# Patient Record
Sex: Male | Born: 1970 | Race: Black or African American | Hispanic: No | Marital: Single | Smoking: Never smoker
Health system: Southern US, Community
[De-identification: ages and names within clinical notes are randomized; demographics above are authoritative.]

## PROBLEM LIST (undated history)

## (undated) DIAGNOSIS — R7303 Prediabetes: Secondary | ICD-10-CM

## (undated) DIAGNOSIS — I10 Essential (primary) hypertension: Secondary | ICD-10-CM

## (undated) HISTORY — DX: Essential (primary) hypertension: I10

## (undated) HISTORY — DX: Prediabetes: R73.03

---

## 2002-11-24 ENCOUNTER — Emergency Department (HOSPITAL_COMMUNITY): Admission: EM | Admit: 2002-11-24 | Discharge: 2002-11-24 | Payer: Self-pay | Admitting: Emergency Medicine

## 2007-12-26 ENCOUNTER — Encounter (INDEPENDENT_AMBULATORY_CARE_PROVIDER_SITE_OTHER): Payer: Self-pay | Admitting: Nurse Practitioner

## 2007-12-26 ENCOUNTER — Ambulatory Visit: Payer: Self-pay | Admitting: Internal Medicine

## 2007-12-26 LAB — CONVERTED CEMR LAB
AST: 27 units/L (ref 0–37)
Albumin: 4.7 g/dL (ref 3.5–5.2)
BUN: 13 mg/dL (ref 6–23)
Calcium: 9.4 mg/dL (ref 8.4–10.5)
Chloride: 106 meq/L (ref 96–112)
Glucose, Bld: 87 mg/dL (ref 70–99)
Lymphs Abs: 2.4 10*3/uL (ref 0.7–4.0)
Monocytes Relative: 9 % (ref 3–12)
Neutro Abs: 2.5 10*3/uL (ref 1.7–7.7)
Neutrophils Relative %: 44 % (ref 43–77)
Potassium: 4.3 meq/L (ref 3.5–5.3)
RBC: 5.14 M/uL (ref 4.22–5.81)
WBC: 5.6 10*3/uL (ref 4.0–10.5)

## 2008-01-17 ENCOUNTER — Encounter (INDEPENDENT_AMBULATORY_CARE_PROVIDER_SITE_OTHER): Payer: Self-pay | Admitting: Nurse Practitioner

## 2008-01-17 ENCOUNTER — Ambulatory Visit: Payer: Self-pay | Admitting: Internal Medicine

## 2008-01-17 LAB — CONVERTED CEMR LAB: Total CHOL/HDL Ratio: 5

## 2008-01-22 ENCOUNTER — Ambulatory Visit: Payer: Self-pay | Admitting: *Deleted

## 2008-01-30 ENCOUNTER — Ambulatory Visit: Payer: Self-pay | Admitting: Internal Medicine

## 2008-09-13 DIAGNOSIS — I10 Essential (primary) hypertension: Secondary | ICD-10-CM

## 2008-09-13 HISTORY — DX: Essential (primary) hypertension: I10

## 2008-11-12 ENCOUNTER — Encounter (INDEPENDENT_AMBULATORY_CARE_PROVIDER_SITE_OTHER): Payer: Self-pay | Admitting: Internal Medicine

## 2008-11-12 ENCOUNTER — Ambulatory Visit: Payer: Self-pay | Admitting: Internal Medicine

## 2008-11-12 LAB — CONVERTED CEMR LAB
ALT: 56 units/L — ABNORMAL HIGH (ref 0–53)
AST: 28 units/L (ref 0–37)
Albumin: 4.7 g/dL (ref 3.5–5.2)
Alkaline Phosphatase: 65 units/L (ref 39–117)
Glucose, Bld: 99 mg/dL (ref 70–99)
LDL Cholesterol: 166 mg/dL — ABNORMAL HIGH (ref 0–99)
PSA: 0.39 ng/mL (ref 0.10–4.00)
Potassium: 4.1 meq/L (ref 3.5–5.3)
Sodium: 141 meq/L (ref 135–145)
Total Protein: 7.5 g/dL (ref 6.0–8.3)
Triglycerides: 116 mg/dL (ref <150)

## 2008-12-16 ENCOUNTER — Ambulatory Visit: Payer: Self-pay | Admitting: Internal Medicine

## 2008-12-16 ENCOUNTER — Encounter (INDEPENDENT_AMBULATORY_CARE_PROVIDER_SITE_OTHER): Payer: Self-pay | Admitting: Internal Medicine

## 2008-12-16 LAB — CONVERTED CEMR LAB
ALT: 36 units/L (ref 0–53)
AST: 23 units/L (ref 0–37)
Albumin: 4.7 g/dL (ref 3.5–5.2)
Calcium: 9.3 mg/dL (ref 8.4–10.5)
Chloride: 107 meq/L (ref 96–112)
LDL Cholesterol: 68 mg/dL (ref 0–99)
Potassium: 4.1 meq/L (ref 3.5–5.3)
Sodium: 143 meq/L (ref 135–145)
Total Protein: 7.3 g/dL (ref 6.0–8.3)

## 2009-01-09 ENCOUNTER — Ambulatory Visit: Payer: Self-pay | Admitting: Internal Medicine

## 2009-02-14 ENCOUNTER — Ambulatory Visit: Payer: Self-pay | Admitting: Internal Medicine

## 2009-05-20 ENCOUNTER — Encounter (INDEPENDENT_AMBULATORY_CARE_PROVIDER_SITE_OTHER): Payer: Self-pay | Admitting: Internal Medicine

## 2009-05-20 ENCOUNTER — Ambulatory Visit: Payer: Self-pay | Admitting: Internal Medicine

## 2009-05-20 LAB — CONVERTED CEMR LAB
ALT: 42 units/L (ref 0–53)
Albumin: 4.3 g/dL (ref 3.5–5.2)
CO2: 23 meq/L (ref 19–32)
Cholesterol: 188 mg/dL (ref 0–200)
Glucose, Bld: 107 mg/dL — ABNORMAL HIGH (ref 70–99)
LDL Cholesterol: 126 mg/dL — ABNORMAL HIGH (ref 0–99)
Potassium: 4.3 meq/L (ref 3.5–5.3)
Sodium: 142 meq/L (ref 135–145)
Total Protein: 6.7 g/dL (ref 6.0–8.3)
Triglycerides: 76 mg/dL (ref <150)

## 2009-11-14 ENCOUNTER — Ambulatory Visit: Payer: Self-pay | Admitting: Internal Medicine

## 2009-11-14 LAB — CONVERTED CEMR LAB
BUN: 10 mg/dL (ref 6–23)
CO2: 27 meq/L (ref 19–32)
Chloride: 106 meq/L (ref 96–112)
Creatinine, Ser: 0.91 mg/dL (ref 0.40–1.50)
HDL: 38 mg/dL — ABNORMAL LOW (ref >39)
LDL Cholesterol: 93 mg/dL (ref 0–99)
Potassium: 4.1 meq/L (ref 3.5–5.3)
Triglycerides: 136 mg/dL (ref <150)
VLDL: 27 mg/dL (ref 0–40)

## 2010-02-19 ENCOUNTER — Ambulatory Visit: Payer: Self-pay | Admitting: Internal Medicine

## 2010-02-19 LAB — CONVERTED CEMR LAB
CO2: 26 meq/L (ref 19–32)
Calcium: 9.3 mg/dL (ref 8.4–10.5)
Creatinine, Ser: 1.04 mg/dL (ref 0.40–1.50)
HDL: 38 mg/dL — ABNORMAL LOW (ref >39)
Sodium: 139 meq/L (ref 135–145)
Total CHOL/HDL Ratio: 3.3
Triglycerides: 87 mg/dL (ref <150)

## 2018-04-24 ENCOUNTER — Ambulatory Visit: Payer: Self-pay | Admitting: Internal Medicine

## 2018-04-24 ENCOUNTER — Telehealth: Payer: Self-pay | Admitting: Licensed Clinical Social Worker

## 2018-04-24 ENCOUNTER — Encounter: Payer: Self-pay | Admitting: Internal Medicine

## 2018-04-24 VITALS — BP 122/88 | HR 78 | Resp 12 | Ht 72.75 in | Wt 319.0 lb

## 2018-04-24 DIAGNOSIS — I10 Essential (primary) hypertension: Secondary | ICD-10-CM

## 2018-04-24 DIAGNOSIS — L989 Disorder of the skin and subcutaneous tissue, unspecified: Secondary | ICD-10-CM

## 2018-04-24 DIAGNOSIS — H547 Unspecified visual loss: Secondary | ICD-10-CM

## 2018-04-24 DIAGNOSIS — Z6841 Body Mass Index (BMI) 40.0 and over, adult: Secondary | ICD-10-CM

## 2018-04-24 MED ORDER — LOSARTAN POTASSIUM-HCTZ 100-25 MG PO TABS: 1.0000 | ORAL_TABLET | Freq: Every day | ORAL | 11 refills | Status: AC

## 2018-04-24 MED ORDER — CARVEDILOL 25 MG PO TABS: 25.0000 mg | ORAL_TABLET | Freq: Two times a day (BID) | ORAL | 3 refills | Status: DC

## 2018-04-24 NOTE — Telephone Encounter (Signed)
LCSW called patient in order to introduce self and provide information about social work services available at the clinic. Left message.

## 2018-04-24 NOTE — Progress Notes (Signed)
Subjective:    Patient ID: Max Greene, male    DOB: 13-Mar-1971, 47 y.o.   MRN: [id number]  HPI   Here to establish  1.  Left leg bump for 2-3 years.  Has not changed with time.  Feels hard.  Has put on some sort of ointment he cannot recall.  He states it is supposed to get rid of bumps.  He does not know if it is a wart remover.   He denies picking at the lesion.    2.  Eye concerns:  Has noted he has to put things farther away from him to focus.  He does not feel his vision is that concerning, but his mother has been pushing him to get seen for his eyes.   He does work on [employer] regularly.  3.  Essential Hypertension:  Diagnosed 9-10 years ago.   Taking Losartan/hctx 100 mg/25 mg and Carvedilol 25 mg twice daily.  4.  BPH:  Taking [place].  Takes many vitamins and supplements.  5.  Morbid Obesity:  Gained fat weight about the same time his hypertension was diagnosed.  Was previously a Pharmacist, communitybody builder and weighed similar weight.    May eat a banana in the morning.   First main meal is lunch from noon-3 p.m.:  Fried chicken, salad, sometimes fish or pasta dish.  Sprite.   May drink glass of soda once or twice daily.  Rest of time it's water.    Dinner later in evening:  Pasta or fried fish or chicken.  Pasta dish with hamburger and cheese.   Current Meds  Medication Sig  . B Complex Vitamins (VITAMIN B COMPLEX PO) Take 5,000 mg by mouth daily.  . carvedilol (COREG) 25 MG tablet Take 25 mg by mouth 2 (two) times daily with a meal.  . Cholecalciferol (VITAMIN D PO) Take by mouth daily.  Marland Kitchen. losartan-hydrochlorothiazide (HYZAAR) 100-25 MG tablet Take 1 tablet by mouth daily.  . Magnesium 400 MG CAPS Take by mouth daily.  . Omega-3 Fatty Acids (FISH OIL PO) Take by mouth daily.  . Zinc 50 MG CAPS Take by mouth. 2 daily    No Known Allergies   Past Medical History:  Diagnosis Date  . Hypertension 2010   Past Surgical History:  None  Family History  Problem  Relation Age of Onset  . GER disease Mother        With esophageal stricture  . Cancer Father        Lung/smoker  . Kidney Stones Sister   . Asthma Brother        Severe   Social History   Socioeconomic History  . Marital status: Single    Spouse name: Not on file  . Number of children: 0  . Years of education: 5912  . Highest education level: 12th grade  Occupational History  . Not on file  Social Needs  . Financial resource strain: Not on file  . Food insecurity:    Worry: Never true    Inability: Never true  . Transportation needs:    Medical: No    Non-medical: No  Tobacco Use  . Smoking status: Never Smoker  . Smokeless tobacco: Never Used  Substance and Sexual Activity  . Alcohol use: Never    Frequency: Never  . Drug use: Never  . Sexual activity: Not on file  Lifestyle  . Physical activity:    Days per week: 0 days    Minutes  per session: 0 min  . Stress: Not on file  Relationships  . Social connections:    Talks on phone: Not on file    Gets together: Not on file    Attends religious service: Not on file    Active member of club or organization: Not on file    Attends meetings of clubs or organizations: Not on file    Relationship status: Not on file  . Intimate partner violence:    Fear of current or ex partner: Not on file    Emotionally abused: Not on file    Physically abused: Not on file    Forced sexual activity: Not on file  Other Topics Concern  . Not on file  Social History Narrative   Originally from [place].   [employer].   Lives with his mother currently and 2 younger brothers.     Has been working on his own business and saving money.     Review of Systems     Objective:   Physical Exam NAD HEENT: PERRL, pupils 2 mm so unable to see discs well, EOMI, TMs pearly gray.  Throat without injection.   Neck:  Supple, No adenopathy, no thyromegaly Chest:  CTA CV:  RRR with normal S1 and S2, No S3, S4 or murmur.  No  carotid bruits.  Carotid, radial pulses normal and equal LE:  No edema, but skin from pretibial area with flaking and dryness.   Left posterior calf with 4 mm superficial dry wart like lesion, well circumscribed.  After consent obtained, patient agreed to freezing of lesion with liquid nitrogen.       Assessment & Plan:  1.  Decreased visual acuity:  Likely presbyopia.  Patient was able to find reading glasses from boxes we have that work well for him.   Optometrist hand out given as well. If goes and needs glasses, discussed he can get a voucher for the glasses with orange card.  2.  Essential Hypertension:  Controlled.  Refilled Carvedilol and Losartan/hctz.  3.  Morbid Obesity:  Discussed lifestyle changes, including diet, eating behavior and regular physical activity at length.  4.  Benign lesion, left posterior calf area:  Frozen.  To call if does not resolve.  5.  HM:  Reportedly had CPE 2 months ago with another clinic.  Send for records. Fasting labs in 1 week:  FLP, CBC, CMP Follow up in 4 months.

## 2018-04-24 NOTE — Patient Instructions (Signed)

## 2018-05-05 ENCOUNTER — Other Ambulatory Visit: Payer: Self-pay

## 2018-05-05 DIAGNOSIS — Z13220 Encounter for screening for lipoid disorders: Secondary | ICD-10-CM

## 2018-05-05 DIAGNOSIS — Z79899 Other long term (current) drug therapy: Secondary | ICD-10-CM

## 2018-05-06 LAB — CBC WITH DIFFERENTIAL/PLATELET
BASOS: 1 %
Basophils Absolute: 0 10*3/uL (ref 0.0–0.2)
EOS (ABSOLUTE): 0.2 10*3/uL (ref 0.0–0.4)
Eos: 3 %
HEMATOCRIT: 46.8 % (ref 37.5–51.0)
Hemoglobin: 15.8 g/dL (ref 13.0–17.7)
Immature Grans (Abs): 0 10*3/uL (ref 0.0–0.1)
Immature Granulocytes: 0 %
LYMPHS ABS: 3.8 10*3/uL — AB (ref 0.7–3.1)
Lymphs: 54 %
MCH: 30.9 pg (ref 26.6–33.0)
MCHC: 33.8 g/dL (ref 31.5–35.7)
MCV: 92 fL (ref 79–97)
MONOS ABS: 0.5 10*3/uL (ref 0.1–0.9)
Monocytes: 7 %
NEUTROS ABS: 2.4 10*3/uL (ref 1.4–7.0)
Neutrophils: 35 %
PLATELETS: 216 10*3/uL (ref 150–450)
RBC: 5.11 x10E6/uL (ref 4.14–5.80)
RDW: 14.9 % (ref 12.3–15.4)
WBC: 6.9 10*3/uL (ref 3.4–10.8)

## 2018-05-06 LAB — COMPREHENSIVE METABOLIC PANEL
A/G RATIO: 1.7 (ref 1.2–2.2)
ALT: 33 IU/L (ref 0–44)
AST: 21 IU/L (ref 0–40)
Albumin: 4.4 g/dL (ref 3.5–5.5)
Alkaline Phosphatase: 80 IU/L (ref 39–117)
BILIRUBIN TOTAL: 0.7 mg/dL (ref 0.0–1.2)
BUN/Creatinine Ratio: 13 (ref 9–20)
BUN: 13 mg/dL (ref 6–24)
CHLORIDE: 99 mmol/L (ref 96–106)
CO2: 24 mmol/L (ref 20–29)
Calcium: 9.5 mg/dL (ref 8.7–10.2)
Creatinine, Ser: 1.02 mg/dL (ref 0.76–1.27)
GFR calc non Af Amer: 86 mL/min/{1.73_m2} (ref >59)
GFR, EST AFRICAN AMERICAN: 99 mL/min/{1.73_m2} (ref >59)
GLOBULIN, TOTAL: 2.6 g/dL (ref 1.5–4.5)
Glucose: 106 mg/dL — ABNORMAL HIGH (ref 65–99)
POTASSIUM: 4.1 mmol/L (ref 3.5–5.2)
SODIUM: 140 mmol/L (ref 134–144)
Total Protein: 7 g/dL (ref 6.0–8.5)

## 2018-05-06 LAB — LIPID PANEL W/O CHOL/HDL RATIO
CHOLESTEROL TOTAL: 212 mg/dL — AB (ref 100–199)
HDL: 36 mg/dL — AB (ref >39)
LDL Calculated: 152 mg/dL — ABNORMAL HIGH (ref 0–99)
TRIGLYCERIDES: 119 mg/dL (ref 0–149)
VLDL CHOLESTEROL CAL: 24 mg/dL (ref 5–40)

## 2018-07-25 ENCOUNTER — Ambulatory Visit: Payer: Self-pay | Admitting: Internal Medicine

## 2018-07-25 ENCOUNTER — Encounter: Payer: Self-pay | Admitting: Internal Medicine

## 2018-07-25 VITALS — BP 140/88 | HR 76 | Resp 14 | Ht 72.75 in | Wt 347.0 lb

## 2018-07-25 DIAGNOSIS — Z23 Encounter for immunization: Secondary | ICD-10-CM

## 2018-07-25 DIAGNOSIS — L989 Disorder of the skin and subcutaneous tissue, unspecified: Secondary | ICD-10-CM

## 2018-07-25 DIAGNOSIS — H547 Unspecified visual loss: Secondary | ICD-10-CM

## 2018-07-25 DIAGNOSIS — E785 Hyperlipidemia, unspecified: Secondary | ICD-10-CM

## 2018-07-25 DIAGNOSIS — R739 Hyperglycemia, unspecified: Secondary | ICD-10-CM

## 2018-07-25 DIAGNOSIS — I10 Essential (primary) hypertension: Secondary | ICD-10-CM

## 2018-07-25 DIAGNOSIS — R7303 Prediabetes: Secondary | ICD-10-CM

## 2018-07-25 DIAGNOSIS — Z6841 Body Mass Index (BMI) 40.0 and over, adult: Secondary | ICD-10-CM

## 2018-07-25 HISTORY — DX: Prediabetes: R73.03

## 2018-07-25 NOTE — Progress Notes (Signed)
   Subjective:    Patient ID: Max Greene, male    DOB: 1970-12-13, 47 y.o.   MRN: [id number]  HPI  1. Morbid Obesity/hyperglycemia/hypercholesterolemia:  Not clear if we weighed him incorrectly at last visit.  If he was weighed correctly, has gained 30 lbs in 3 months.  He states his clothes fit him the same.  Not physically active recently, but states he will start doing so.   Drinking soda one daily.  First meal 10:46 a.m.:  Fast food meal--[employer] chicken, fried fish  2.  Lesion on back of left calf did not resolve.    3.  Hypertension:  Not taking Losartan/HCTZ 100-25 mg.  Sounds like did not get his Losartan combination at the [facility] pharmacy.  He is taking Carvedilol.  Not clear if he takes twice daily on regular basis.  Call to [facility] and found out he did pick up his Losartan/HCTZ once in August, but did not pick up again.   4.  Vision:  Did not get eye check with office in flyer from last visit.  He does not even recall.  Current Meds  Medication Sig  . carvedilol (COREG) 25 MG tablet Take 1 tablet (25 mg total) by mouth 2 (two) times daily with a meal.  . Cholecalciferol (VITAMIN D PO) Take by mouth daily.  . Magnesium 400 MG CAPS Take by mouth daily.  . Omega-3 Fatty Acids (FISH OIL PO) Take by mouth daily.  . Zinc 50 MG CAPS Take by mouth. 2 daily    No Known Allergies Review of Systems     Objective:   Physical Exam Lungs:  CTA CV:  RRR without murmur or rub.  Radial and DP pulses normal and equal LE:  Trace pitting edema of lower legs bilaterally.  Lesion on left lateral posterior calf still there, about 1 cm, dry and elevated, well circumscribed--frozen with liquid nitrogen again.       Assessment & Plan:  1.  Hypertension:  To pick up Losartan/HCTZ and get restarted.  BP and A1c check in 2 weeks.  2.  Hyperglycemia:  A1C with labs in 2 weeks.  3.  Morbid obesity:  Discussed diet and physical activity at length.  He is changing jobs this week and  feels he can get back to better lifestyle.  Will bring his mother in next visit to discuss what she cooks for him as well.  Discussed making small goals to shoot for. Weight entered last check likely incorrect.  4.  Hyperlipidemia:  As above  5.   Benign skin lesion:  Frozen.  6.  HM:  Flu vaccine

## 2018-07-25 NOTE — Patient Instructions (Signed)

## 2018-08-08 ENCOUNTER — Other Ambulatory Visit: Payer: Self-pay

## 2018-08-08 VITALS — BP 150/80 | HR 64

## 2018-08-08 DIAGNOSIS — R739 Hyperglycemia, unspecified: Secondary | ICD-10-CM

## 2018-08-08 DIAGNOSIS — I10 Essential (primary) hypertension: Secondary | ICD-10-CM

## 2018-08-08 NOTE — Progress Notes (Signed)
Patient BP still running high. Patient states he missed last nights dose and this morning. Per Dr. Delrae AlfredMulberry have patient to come back when he has taken his medication. Patient to come back in 2 weeks for repeat BP check. Patient verbalized understanding.

## 2018-08-09 LAB — HGB A1C W/O EAG: HEMOGLOBIN A1C: 6.1 % — AB (ref 4.8–5.6)

## 2018-08-22 ENCOUNTER — Ambulatory Visit: Payer: Self-pay

## 2018-08-22 VITALS — BP 138/88 | HR 80

## 2018-08-22 DIAGNOSIS — I10 Essential (primary) hypertension: Secondary | ICD-10-CM

## 2018-08-22 NOTE — Progress Notes (Signed)
Patient BP now in normal range. Patient informed to continue current dose of medication and make sure he is not skipping doses. Patient verbalized understanding.

## 2018-09-12 ENCOUNTER — Other Ambulatory Visit: Payer: Self-pay

## 2018-09-12 MED ORDER — CARVEDILOL 25 MG PO TABS: 25.0000 mg | ORAL_TABLET | Freq: Two times a day (BID) | ORAL | 2 refills | Status: AC

## 2018-10-08 ENCOUNTER — Encounter: Payer: Self-pay | Admitting: Internal Medicine

## 2018-10-25 ENCOUNTER — Ambulatory Visit: Payer: Self-pay | Admitting: Internal Medicine

## 2019-10-30 ENCOUNTER — Ambulatory Visit: Payer: Self-pay | Admitting: Internal Medicine

## 2019-11-10 ENCOUNTER — Other Ambulatory Visit: Payer: Self-pay | Admitting: Internal Medicine

## 2020-01-28 ENCOUNTER — Other Ambulatory Visit: Payer: Self-pay

## 2020-01-28 ENCOUNTER — Encounter (HOSPITAL_COMMUNITY): Payer: Self-pay

## 2020-01-28 ENCOUNTER — Emergency Department (HOSPITAL_COMMUNITY)
Admission: EM | Admit: 2020-01-28 | Discharge: 2020-01-29 | Disposition: A | Discharge status: Home / Self Care | Payer: Self-pay | Attending: Emergency Medicine | Admitting: Emergency Medicine

## 2020-01-28 DIAGNOSIS — Z5321 Procedure and treatment not carried out due to patient leaving prior to being seen by health care provider: Secondary | ICD-10-CM | POA: Insufficient documentation

## 2020-01-28 DIAGNOSIS — R2243 Localized swelling, mass and lump, lower limb, bilateral: Secondary | ICD-10-CM | POA: Insufficient documentation

## 2020-01-28 NOTE — ED Triage Notes (Signed)
BLE swelling for 2 weeks. Sts it goes away when he takes his [employer] fast streamer for htn.

## 2020-01-29 ENCOUNTER — Ambulatory Visit
Admission: RE | Admit: 2020-01-29 | Discharge: 2020-01-29 | Disposition: A | Discharge status: Home / Self Care | Payer: No Typology Code available for payment source | Source: Ambulatory Visit | Attending: Cardiovascular Disease | Admitting: Cardiovascular Disease

## 2020-01-29 ENCOUNTER — Other Ambulatory Visit: Payer: Self-pay | Admitting: Cardiovascular Disease

## 2020-01-29 DIAGNOSIS — R0602 Shortness of breath: Secondary | ICD-10-CM

## 2020-01-29 DIAGNOSIS — R600 Localized edema: Secondary | ICD-10-CM

## 2020-01-29 NOTE — ED Notes (Addendum)
Pt eloped from waiting room. Called 3X. VS stable.  

## 2021-01-28 ENCOUNTER — Encounter (HOSPITAL_COMMUNITY): Payer: Self-pay

## 2021-01-28 ENCOUNTER — Emergency Department (HOSPITAL_COMMUNITY)
Admission: EM | Admit: 2021-01-28 | Discharge: 2021-01-28 | Disposition: A | Discharge status: Home / Self Care | Payer: Self-pay | Attending: Emergency Medicine | Admitting: Emergency Medicine

## 2021-01-28 ENCOUNTER — Emergency Department (HOSPITAL_COMMUNITY): Payer: Self-pay

## 2021-01-28 ENCOUNTER — Other Ambulatory Visit: Payer: Self-pay

## 2021-01-28 DIAGNOSIS — S6710XA Crushing injury of unspecified finger(s), initial encounter: Secondary | ICD-10-CM

## 2021-01-28 DIAGNOSIS — Z23 Encounter for immunization: Secondary | ICD-10-CM | POA: Insufficient documentation

## 2021-01-28 DIAGNOSIS — I10 Essential (primary) hypertension: Secondary | ICD-10-CM | POA: Insufficient documentation

## 2021-01-28 DIAGNOSIS — W230XXA Caught, crushed, jammed, or pinched between moving objects, initial encounter: Secondary | ICD-10-CM | POA: Insufficient documentation

## 2021-01-28 DIAGNOSIS — Z79899 Other long term (current) drug therapy: Secondary | ICD-10-CM | POA: Insufficient documentation

## 2021-01-28 DIAGNOSIS — S60141A Contusion of right ring finger with damage to nail, initial encounter: Secondary | ICD-10-CM | POA: Insufficient documentation

## 2021-01-28 DIAGNOSIS — S6010XA Contusion of unspecified finger with damage to nail, initial encounter: Secondary | ICD-10-CM

## 2021-01-28 MED ORDER — LIDOCAINE HCL 2 % IJ SOLN
10.0000 mL | Freq: Once | INTRAMUSCULAR | Status: AC
  Administered 2021-01-28: 200 mg via INTRADERMAL
  Filled 2021-01-28: qty 20

## 2021-01-28 MED ORDER — TETANUS-DIPHTH-ACELL PERTUSSIS 5-2.5-18.5 LF-MCG/0.5 IM SUSY
0.5000 mL | PREFILLED_SYRINGE | Freq: Once | INTRAMUSCULAR | Status: AC
  Administered 2021-01-28: 0.5 mL via INTRAMUSCULAR
  Filled 2021-01-28: qty 0.5

## 2021-01-28 MED ORDER — ACETAMINOPHEN 500 MG PO TABS: 1000.0000 mg | ORAL_TABLET | Freq: Once | ORAL | Status: DC

## 2021-01-28 MED ORDER — IBUPROFEN 600 MG PO TABS: 600.0000 mg | ORAL_TABLET | Freq: Four times a day (QID) | ORAL | 0 refills | Status: DC | PRN

## 2021-01-28 NOTE — ED Triage Notes (Signed)
3rd finger (middle) got stomped in door - bluish discoloration of finger nail noted, able to bend.

## 2021-01-28 NOTE — ED Provider Notes (Signed)
[facility] EMERGENCY DEPARTMENT Provider Note   CSN: [id number] Arrival date & time: 01/28/21  1846     History Chief Complaint  Patient presents with  . Finger Injury    Max Greene is a 50 y.o. male.  The history is provided by the patient. No language interpreter was used.     50 year old male presenting for evaluation of a finger injury.  Patient report earlier today he accidentally injured his finger when he slammed it against a door.  Impact was to his right middle finger on his dominant hand.  He reported 5/10 sharp throbbing pain about the finger from the impact.  He denies any numbness and no other injury.  He is unable to recall his last tetanus.  He denies any specific treatment tried.  Past Medical History:  Diagnosis Date  . Hypertension 2010  . Prediabetes 07/25/2018    Patient Active Problem List   Diagnosis Date Noted  . Prediabetes 07/25/2018  . Hyperlipidemia 07/25/2018  . Morbid obesity with BMI of 40.0-44.9, adult (HCC) 07/25/2018  . Hypertension     History reviewed. No pertinent surgical history.     Family History  Problem Relation Age of Onset  . GER disease Mother        With esophageal stricture  . Cancer Father        Lung/smoker  . Kidney Stones Sister   . Asthma Brother        Severe    Social History   Tobacco Use  . Smoking status: Never Smoker  . Smokeless tobacco: Never Used  Substance Use Topics  . Alcohol use: Never  . Drug use: Never    Home Medications Prior to Admission medications   Medication Sig Start Date End Date Taking? Authorizing Provider  B Complex Vitamins (VITAMIN B COMPLEX PO) Take 5,000 mg by mouth daily.    [provider]  carvedilol (COREG) 25 MG tablet Take 1 tablet (25 mg total) by mouth 2 (two) times daily with a meal. 09/12/18   Julieanne Manson, MD  Cholecalciferol (VITAMIN D PO) Take by mouth daily.    [provider]   losartan-hydrochlorothiazide (HYZAAR) 100-25 MG tablet Take 1 tablet by mouth daily. Patient not taking: Reported on 07/25/2018 04/24/18   Julieanne Manson, MD  Magnesium 400 MG CAPS Take by mouth daily.    [provider]  Omega-3 Fatty Acids (FISH OIL PO) Take by mouth daily.    [provider]  Zinc 50 MG CAPS Take by mouth. 2 daily    [provider]    Allergies    Patient has no known allergies.  Review of Systems   Review of Systems  Constitutional: Negative for fever.  Musculoskeletal: Positive for arthralgias.  Skin: Negative for wound.  Neurological: Negative for numbness.    Physical Exam Updated Vital Signs BP (!) 168/103 (BP Location: Right Arm)   Pulse 79   Temp 98.4 F (36.9 C) (Oral)   Resp (!) 22   Ht 6\' 2"  (1.88 m)   Wt (!) 161 kg   SpO2 97%   BMI 45.57 kg/m   Physical Exam Vitals and nursing note reviewed.  Constitutional:      General: He is not in acute distress.    Appearance: He is well-developed.  HENT:     Head: Atraumatic.  Eyes:     Conjunctiva/sclera: Conjunctivae normal.  Musculoskeletal:        General: Signs of injury (  Right middle finger: Tenderness along the distal phalanx on palpation with subungual hematoma involve approximately 90% of the nail.  No joint involvement.  Sensation intact.) present.     Cervical back: Neck supple.  Skin:    Findings: No rash.  Neurological:     Mental Status: He is alert.     ED Results / Procedures / Treatments   Labs (all labs ordered are listed, but only abnormal results are displayed) Labs Reviewed - No data to display  EKG None  Radiology DG Finger Middle Right  Result Date: 01/28/2021 CLINICAL DATA:  Status post trauma. EXAM: RIGHT MIDDLE FINGER 2+V COMPARISON:  None. FINDINGS: Acute, comminuted fracture deformity is seen involving the tuft of the distal phalanx of the third right finger. There is no evidence of dislocation. Diffuse soft tissue swelling  is noted. IMPRESSION: Acute fracture of the distal phalanx of the third right finger. Electronically Signed   By: Aram Candela M.D.   On: 01/28/2021 19:48    Procedures .Ortho Injury Treatment  Date/Time: 01/28/2021 9:39 PM Performed by: Fayrene Helper, PA-C Authorized by: Fayrene Helper, PA-C   Consent:    Consent obtained:  Verbal   Consent given by:  Patient   Risks discussed:  Fracture and vascular damage   Alternatives discussed:  No treatmentInjury location: finger Location details: right ring finger Injury type: fracture Fracture type: distal phalanx MCP joint involved: no IP joint involved: no Pre-procedure neurovascular assessment: neurovascularly intact Pre-procedure distal perfusion: normal Pre-procedure neurological function: normal Pre-procedure range of motion: normal Anesthesia: digital block  Anesthesia: Local anesthesia used: yes Local Anesthetic: lidocaine 2% without epinephrine Anesthetic total: 5 mL  Patient sedated: NoManipulation performed: no Immobilization: splint Splint type: static finger Splint Applied by: Ortho Tech Supplies used: aluminum splint Post-procedure neurovascular assessment: post-procedure neurovascularly intact Post-procedure distal perfusion: normal Post-procedure neurological function: normal Post-procedure range of motion: normal Comments: Patient has a subungual hematoma involving 90% of the fingernail on the right middle finger.  Digital nerve block performed, a Bovie cauterizing tool was used to bur a hole into the nail to evacuate the subungual hematoma.  Patient tolerates well.  Finger splint placed for protection.      Medications Ordered in ED Medications  acetaminophen (TYLENOL) tablet 1,000 mg (has no administration in time range)  Tdap (BOOSTRIX) injection 0.5 mL (0.5 mLs Intramuscular Given 01/28/21 2053)  lidocaine (XYLOCAINE) 2 % (with pres) injection 200 mg (200 mg Intradermal Given by Other 01/28/21 2053)    ED  Course  I have reviewed the triage vital signs and the nursing notes.  Pertinent labs & imaging results that were available during my care of the patient were reviewed by me and considered in my medical decision making (see chart for details).    MDM Rules/Calculators/A&P                          BP (!) 168/103 (BP Location: Right Arm)   Pulse 79   Temp 98.4 F (36.9 C) (Oral)   Resp (!) 22   Ht 6\' 2"  (1.88 m)   Wt (!) 161 kg   SpO2 97%   BMI 45.57 kg/m   Final Clinical Impression(s) / ED Diagnoses Final diagnoses:  Subungual hematoma of digit of hand, initial encounter  Crushing injury of finger, initial encounter    Rx / DC Orders ED Discharge Orders         Ordered    ibuprofen (ADVIL) 600  MG tablet  Every 6 hours PRN        01/28/21 2142        8:48 PM Patient accidentally jammed his right middle finger at the distal phalanx.  X-ray of the finger demonstrate acute fracture of the to the placement of the third right finger without evidence of dislocation.  He does have a subungual hematoma involving approximately 90% of the nailbed.  He will benefit from trephination and will place finger in a finger splint.  Will update tetanus.  9:41 PM Patient has a subungual hematoma involving 90% of the fingernail on the right middle finger.  Digital nerve block performed, a Bovie cauterizing tool was used to bur a hole into the nail to evacuate the subungual hematoma.  Patient tolerates well.  Finger splint placed for protection.   Fayrene Helper, PA-C 01/28/21 2144    Cheryll Cockayne, MD 01/30/21 [phone]

## 2021-01-28 NOTE — Discharge Instructions (Signed)
You have injured your right middle finger.  The tip of the bone of the finger was fractured.  Please wear finger splint for protection.  Take ibuprofen as needed for pain.

## 2021-10-09 IMAGING — DX DG FINGER MIDDLE 2+V*R*
3 series · 3 of 3 positions shown · non-contrast
Comparison: None.

CLINICAL DATA: Status post trauma.

EXAM:
RIGHT MIDDLE FINGER 2+V

[finger ap]
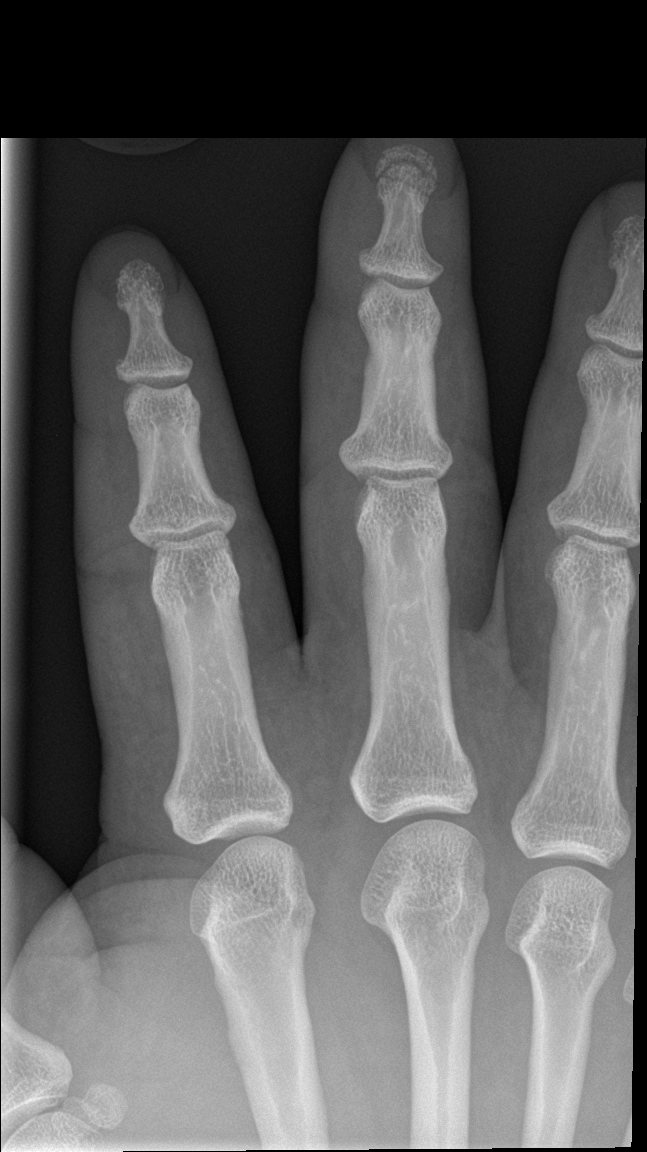

[finger obl]
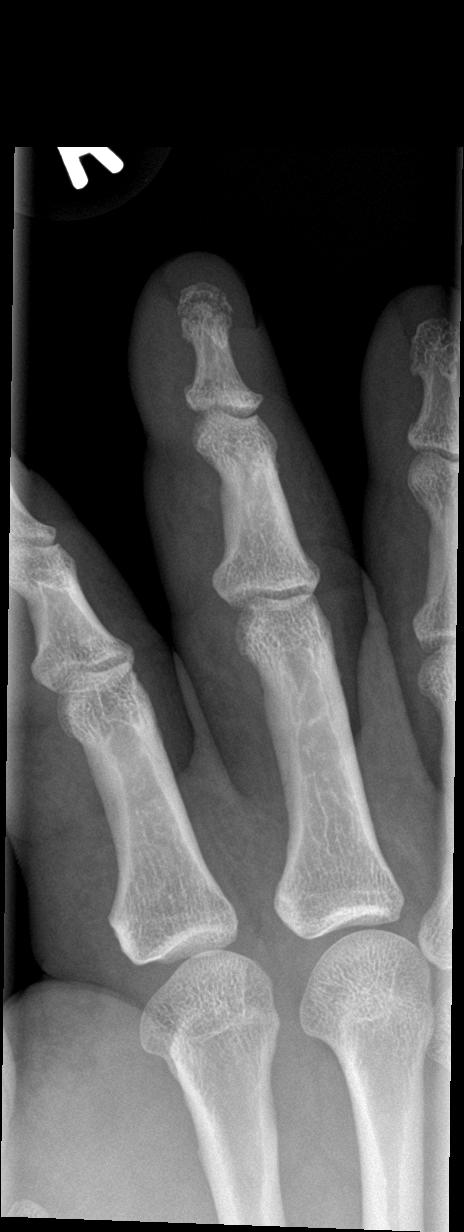

[finger lat]
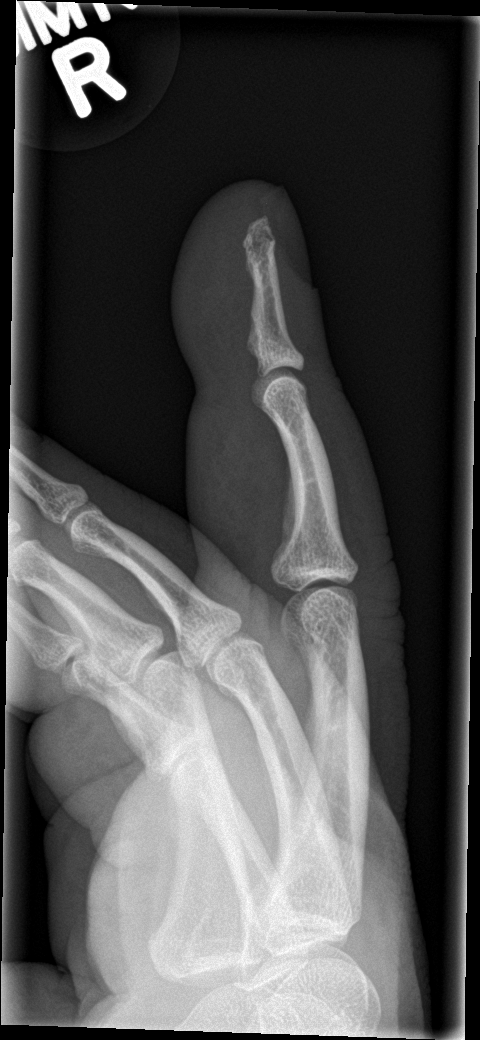

[3 of 3 positions shown; findings below may reference images not displayed]

FINDINGS: Acute, comminuted fracture deformity is seen involving the tuft of
the distal phalanx of the third right finger. There is no evidence
of dislocation. Diffuse soft tissue swelling is noted.
IMPRESSION: Acute fracture of the distal phalanx of the third right finger.

## 2022-05-08 ENCOUNTER — Emergency Department (HOSPITAL_BASED_OUTPATIENT_CLINIC_OR_DEPARTMENT_OTHER)
Admission: EM | Admit: 2022-05-08 | Discharge: 2022-05-08 | Disposition: A | Discharge status: Home / Self Care | Payer: Self-pay | Attending: Emergency Medicine | Admitting: Emergency Medicine

## 2022-05-08 ENCOUNTER — Other Ambulatory Visit: Payer: Self-pay

## 2022-05-08 ENCOUNTER — Encounter (HOSPITAL_BASED_OUTPATIENT_CLINIC_OR_DEPARTMENT_OTHER): Payer: Self-pay

## 2022-05-08 DIAGNOSIS — I10 Essential (primary) hypertension: Secondary | ICD-10-CM | POA: Insufficient documentation

## 2022-05-08 DIAGNOSIS — Z79899 Other long term (current) drug therapy: Secondary | ICD-10-CM | POA: Insufficient documentation

## 2022-05-08 DIAGNOSIS — R600 Localized edema: Secondary | ICD-10-CM | POA: Insufficient documentation

## 2022-05-08 NOTE — Discharge Instructions (Signed)
It was a pleasure taking care of you today!  It is important that you continue to take all of your medications as prescribed to you.  Also important that you call your primary care provider on 05/10/2022 to set up a follow-up appointment regarding medication management.  Attached is information for the on-call lung doctor, call to set up a follow-up appointment regarding your sleep habits.  Attached is information regarding nutrition habits.  Return to the emergency department for experiencing increasing/worsening chest pain, shortness of breath, worsening symptoms.

## 2022-05-08 NOTE — ED Provider Notes (Signed)
[facility] EMERGENCY DEPT Provider Note   CSN: [id number] Arrival date & time: 05/08/22  1055     History  Chief Complaint  Patient presents with   Leg Swelling    Max Greene is a 51 y.o. male who presents to the ED complaining of chronic BLE swelling onset 2-3 years.  Denies pain to his bilateral lower extremities.  Denies history of CHF.  Has hypertension and takes his hydrochlorothiazide as prescribed.  He has been evaluated by his primary care provider for his symptoms.  Was informed by his primary care provider to continue taking his medications and work on [employer].  Denies chest pain, shortness of breath.  Patient notes that he stays up long hours throughout the night due to being on his laptop and then subsequently throughout the day he is sleepy.    The history is provided by the patient. No language interpreter was used.       Home Medications Prior to Admission medications   Medication Sig Start Date End Date Taking? Authorizing Provider  B Complex Vitamins (VITAMIN B COMPLEX PO) Take 5,000 mg by mouth daily.    [provider]  carvedilol (COREG) 25 MG tablet Take 1 tablet (25 mg total) by mouth 2 (two) times daily with a meal. 09/12/18   Julieanne Manson, MD  Cholecalciferol (VITAMIN D PO) Take by mouth daily.    [provider]  ibuprofen (ADVIL) 600 MG tablet Take 1 tablet (600 mg total) by mouth every 6 (six) hours as needed. 01/28/21   Fayrene Helper, PA-C  losartan-hydrochlorothiazide (HYZAAR) 100-25 MG tablet Take 1 tablet by mouth daily. Patient not taking: Reported on 07/25/2018 04/24/18   Julieanne Manson, MD  Magnesium 400 MG CAPS Take by mouth daily.    [provider]  Omega-3 Fatty Acids (FISH OIL PO) Take by mouth daily.    [provider]  Zinc 50 MG CAPS Take by mouth. 2 daily    [provider]      Allergies    Patient has no known allergies.    Review of Systems    Review of Systems  Respiratory:  Negative for shortness of breath.   Cardiovascular:  Negative for chest pain.  Musculoskeletal:  Positive for joint swelling. Negative for arthralgias.  Skin:  Negative for color change and wound.  All other systems reviewed and are negative.   Physical Exam Updated Vital Signs BP 118/73 (BP Location: Left Arm)   Pulse 80   Temp 98.4 F (36.9 C)   Resp (!) 22   SpO2 97%  Physical Exam Vitals and nursing note reviewed.  Constitutional:      General: He is not in acute distress.    Appearance: He is not diaphoretic.  HENT:     Head: Normocephalic and atraumatic.     Mouth/Throat:     Pharynx: No oropharyngeal exudate.  Eyes:     General: No scleral icterus.    Conjunctiva/sclera: Conjunctivae normal.  Cardiovascular:     Rate and Rhythm: Normal rate and regular rhythm.     Pulses: Normal pulses.     Heart sounds: Normal heart sounds.  Pulmonary:     Effort: Pulmonary effort is normal. No respiratory distress.     Breath sounds: Normal breath sounds. No wheezing.  Abdominal:     General: Bowel sounds are normal.     Palpations: Abdomen is soft. There is no mass.     Tenderness: There is no  abdominal tenderness. There is no guarding or rebound.  Musculoskeletal:        General: Normal range of motion.     Cervical back: Normal range of motion and neck supple.     Comments: 1+ edema noted to bilateral lower extremities.  No overlying skin changes.  No erythema, obvious deformity.  Pedal pulses intact bilaterally.  Full range of motion of bilateral lower extremities.  Skin:    General: Skin is warm and dry.  Neurological:     Mental Status: He is alert.  Psychiatric:        Behavior: Behavior normal.     ED Results / Procedures / Treatments   Labs (all labs ordered are listed, but only abnormal results are displayed) Labs Reviewed - No data to display  EKG None  Radiology No results found.  Procedures Procedures     Medications Ordered in ED Medications - No data to display  ED Course/ Medical Decision Making/ A&P Clinical Course as of 05/08/22 1314  Sat May 08, 2022  1257 In depth conversation held with patient and mother at bedside regarding chronic edema to bilateral lower extremities.  Patient again reiterates notes that his swelling goes down when he takes his HCTZ.  Discussed with patient importance of taking his medications as they are prescribed.  Also discussed with patient that he needs to follow-up with his primary care provider for further medication management.  Discussed with patient I will provide him with information for the pulmonologist to follow-up regarding his sleep habits.  Discussed with patient nutrition habits.  Answered all available questions.  Patient appears safe for discharge. [SB]    Clinical Course User Index [SB] Felicia Both A, PA-C                           Medical Decision Making  Pt presents with BLE swelling onset 2-3 years. No pain to BLE. Has been evaluated by his PCP. Vital signs, pt afebrile. On exam, pt with 1+ edema noted to bilateral lower extremities.  No overlying skin changes.  No erythema, obvious deformity.  Pedal pulses intact bilaterally.  Full range of motion of bilateral lower extremities. No acute cardiovascular, respiratory exam findings. Differential diagnosis includes CHF, DVT, cellulitis, abscess.    Co morbidities that complicate the patient evaluation: Hypertension Hyperlipidemia    Disposition: Presentation suspicious for chronic bilateral lower leg swelling.  Doubt CHF, DVT, cellulitis or abscess at this time.   After consideration of the diagnostic results and the patients response to treatment, I feel that the patient would benefit from Discharge home. Patient notes that his leg swelling resolves when he takes his losartan-HCTZ, however it makes him urinate a lot.  Discussed with patient importance of continuing to take this  medication.  Also discussed with patient importance of follow-up with primary care provider this week for a follow-up appointment for medication management.  Will provide patient with information for pulmonologist regarding his sleep habits. Supportive care measures and strict return precautions discussed with patient at bedside. Pt acknowledges and verbalizes understanding. Pt appears safe for discharge. Follow up as indicated in discharge paperwork.    This chart was dictated using voice recognition software, Dragon. Despite the best efforts of this provider to proofread and correct errors, errors may still occur which can change documentation meaning.  Final Clinical Impression(s) / ED Diagnoses Final diagnoses:  Bilateral lower extremity edema    Rx / DC Orders ED  Discharge Orders     None         Remo Kirschenmann A, PA-C 05/08/22 1408    Rondel Baton, MD 05/08/22 [phone]

## 2022-05-08 NOTE — ED Triage Notes (Signed)
He c/o chronic waxing and waning dependent edema (one of his current meds is HCTZ). He is in no distress. He also describes sx which may indicate sleep apnea.

## 2022-05-22 ENCOUNTER — Other Ambulatory Visit: Payer: Self-pay

## 2022-05-22 ENCOUNTER — Emergency Department (HOSPITAL_COMMUNITY)
Admission: EM | Admit: 2022-05-22 | Discharge: 2022-05-22 | Disposition: A | Discharge status: Home / Self Care | Payer: Self-pay | Attending: Emergency Medicine | Admitting: Emergency Medicine

## 2022-05-22 ENCOUNTER — Emergency Department (HOSPITAL_COMMUNITY): Payer: Self-pay

## 2022-05-22 ENCOUNTER — Encounter (HOSPITAL_COMMUNITY): Payer: Self-pay

## 2022-05-22 DIAGNOSIS — R600 Localized edema: Secondary | ICD-10-CM

## 2022-05-22 DIAGNOSIS — R0789 Other chest pain: Secondary | ICD-10-CM | POA: Insufficient documentation

## 2022-05-22 DIAGNOSIS — M7989 Other specified soft tissue disorders: Secondary | ICD-10-CM | POA: Insufficient documentation

## 2022-05-22 LAB — CBC
HCT: 44.6 % (ref 39.0–52.0)
Hemoglobin: 15.5 g/dL (ref 13.0–17.0)
MCH: 31.4 pg (ref 26.0–34.0)
MCHC: 34.8 g/dL (ref 30.0–36.0)
MCV: 90.3 fL (ref 80.0–100.0)
Platelets: 231 10*3/uL (ref 150–400)
RBC: 4.94 MIL/uL (ref 4.22–5.81)
RDW: 13.7 % (ref 11.5–15.5)
WBC: 6.8 10*3/uL (ref 4.0–10.5)
nRBC: 0 % (ref 0.0–0.2)

## 2022-05-22 LAB — URINALYSIS, ROUTINE W REFLEX MICROSCOPIC
Bilirubin Urine: NEGATIVE
Glucose, UA: NEGATIVE mg/dL
Hgb urine dipstick: NEGATIVE
Ketones, ur: NEGATIVE mg/dL
Leukocytes,Ua: NEGATIVE
Nitrite: NEGATIVE
Protein, ur: NEGATIVE mg/dL
Specific Gravity, Urine: 1.016 (ref 1.005–1.030)
pH: 6 (ref 5.0–8.0)

## 2022-05-22 LAB — BASIC METABOLIC PANEL
Anion gap: 11 (ref 5–15)
BUN: 10 mg/dL (ref 6–20)
CO2: 27 mmol/L (ref 22–32)
Calcium: 9.4 mg/dL (ref 8.9–10.3)
Chloride: 103 mmol/L (ref 98–111)
Creatinine, Ser: 0.98 mg/dL (ref 0.61–1.24)
GFR, Estimated: >60 mL/min (ref >60)
Glucose, Bld: 109 mg/dL — ABNORMAL HIGH (ref 70–99)
Potassium: 3.7 mmol/L (ref 3.5–5.1)
Sodium: 141 mmol/L (ref 135–145)

## 2022-05-22 LAB — TROPONIN I (HIGH SENSITIVITY)
Troponin I (High Sensitivity): 48 ng/L — ABNORMAL HIGH (ref <18)
Troponin I (High Sensitivity): 42 ng/L — ABNORMAL HIGH (ref <18)

## 2022-05-22 LAB — BRAIN NATRIURETIC PEPTIDE: B Natriuretic Peptide: 65.4 pg/mL (ref 0.0–100.0)

## 2022-05-22 MED ORDER — FUROSEMIDE 20 MG PO TABS: 20.0000 mg | ORAL_TABLET | Freq: Every day | ORAL | 0 refills | Status: AC

## 2022-05-22 NOTE — ED Provider Notes (Incomplete)
[facility] EMERGENCY DEPARTMENT Provider Note   CSN: [id number] Arrival date & time: 05/22/22  1659     History {Add pertinent medical, surgical, social history, OB history to HPI:1} Chief Complaint  Patient presents with  . Leg Swelling    Max Greene is a 51 y.o. male.  HPI  Patient is a 51 year old male with past medical history significant for hypertension, prediabetes, hyperlipidemia, morbid obesity   Patient presents emergency room today with complaints of chest pain over the past 1 week.  Seems that he also has some shortness of breath with this.  He describes the episodes of chest pain as occurring when he is lifting pallets.  He does not have any chest pain when walking up stairs or walking for a great distance.  He denies any nausea lightness or dizziness.  He states he is suffering from continued bilateral lower extremity swelling which he states he has been struggling with for some time.  He states he takes all of his medications however on further questioning it seems that he does not reliably take his hydrochlorothiazide.  He follows with cardiology Dr. Doylene Canard and states he had a stress test done 1 year ago and was told that it was normal.  He does not currently have any chest pain.  He states that his last episode of chest pain was yesterday when he was lifting a heavy pallet.  He denies any asymmetric swelling in his legs.  Denies any hemoptysis     Home Medications Prior to Admission medications   Medication Sig Start Date End Date Taking? Authorizing Provider  furosemide (LASIX) 20 MG tablet Take 1 tablet (20 mg total) by mouth daily. 05/22/22  Yes Marvena Tally, Ova Freshwater S, PA  B Complex Vitamins (VITAMIN B COMPLEX PO) Take 5,000 mg by mouth daily.    [provider]  carvedilol (COREG) 25 MG tablet Take 1 tablet (25 mg total) by mouth 2 (two) times daily with a meal. 09/12/18   Mack Hook, MD  Cholecalciferol (VITAMIN D PO)  Take by mouth daily.    [provider]  losartan-hydrochlorothiazide (HYZAAR) 100-25 MG tablet Take 1 tablet by mouth daily. Patient not taking: Reported on 07/25/2018 04/24/18   Mack Hook, MD  Magnesium 400 MG CAPS Take by mouth daily.    [provider]  Omega-3 Fatty Acids (FISH OIL PO) Take by mouth daily.    [provider]  Zinc 50 MG CAPS Take by mouth. 2 daily    [provider]      Allergies    Patient has no known allergies.    Review of Systems   Review of Systems  Physical Exam Updated Vital Signs BP 138/88 (BP Location: Right Arm)   Pulse 74   Temp 97.6 F (36.4 C) (Oral)   Resp 17   Ht 6\' 2"  (1.88 m)   Wt (!) 170.1 kg   SpO2 100%   BMI 48.15 kg/m  Physical Exam Vitals and nursing note reviewed.  Constitutional:      General: He is not in acute distress. HENT:     Head: Normocephalic and atraumatic.     Nose: Nose normal.  Eyes:     General: No scleral icterus. Cardiovascular:     Rate and Rhythm: Normal rate and regular rhythm.     Pulses: Normal pulses.     Heart sounds: Normal heart sounds.  Pulmonary:     Effort: Pulmonary effort is normal. No respiratory  distress.     Breath sounds: Normal breath sounds. No wheezing.  Abdominal:     Palpations: Abdomen is soft.     Tenderness: There is no abdominal tenderness. There is no guarding or rebound.  Musculoskeletal:     Cervical back: Normal range of motion.     Right lower leg: Edema present.     Left lower leg: Edema present.     Comments: Bilateral symmetric nonpitting edema  Skin:    General: Skin is warm and dry.     Capillary Refill: Capillary refill takes less than 2 seconds.  Neurological:     Mental Status: He is alert. Mental status is at baseline.  Psychiatric:        Mood and Affect: Mood normal.        Behavior: Behavior normal.     ED Results / Procedures / Treatments   Labs (all labs ordered are listed, but only abnormal results  are displayed) Labs Reviewed  BASIC METABOLIC PANEL - Abnormal; Notable for the following components:      Result Value   Glucose, Bld 109 (*)    All other components within normal limits  TROPONIN I (HIGH SENSITIVITY) - Abnormal; Notable for the following components:   Troponin I (High Sensitivity) 48 (*)    All other components within normal limits  TROPONIN I (HIGH SENSITIVITY) - Abnormal; Notable for the following components:   Troponin I (High Sensitivity) 42 (*)    All other components within normal limits  CBC  BRAIN NATRIURETIC PEPTIDE  URINALYSIS, ROUTINE W REFLEX MICROSCOPIC    EKG EKG Interpretation  Date/Time:  Saturday May 22 2022 17:37:24 EDT Ventricular Rate:  84 PR Interval:  198 QRS Duration: 78 QT Interval:  358 QTC Calculation: 423 R Axis:   16 Text Interpretation: Normal sinus rhythm Cannot rule out Anterior infarct , age undetermined Lateral injury pattern Abnormal ECG No previous ECGs available Confirmed by Jacalyn Lefevre [phone]) on 05/22/2022 6:25:35 PM  Radiology DG Chest 2 View  Result Date: 05/22/2022 CLINICAL DATA:  Chest pain and dyspnea EXAM: CHEST - 2 VIEW COMPARISON:  Radiographs 01/29/2020 FINDINGS: Stable cardiomediastinal silhouette. Bibasilar atelectasis. No other focal consolidation, pleural effusion, or pneumothorax. No acute osseous abnormality. IMPRESSION: No active cardiopulmonary disease. Electronically Signed   By: Minerva Fester M.D.   On: 05/22/2022 19:01    Procedures Procedures  {Document cardiac monitor, telemetry assessment procedure when appropriate:1}  Medications Ordered in ED Medications - No data to display  ED Course/ Medical Decision Making/ A&P Clinical Course as of 05/22/22 2232  Sat May 22, 2022  1904 States CP exertional for 1 week.  No pain currently.  [WF]  1905 No smoking hx, DM,   Yes HTN, cholesterol on meds.   PCP is [facility] off [address] [WF]  [address] [WF]    Clinical  Course User Index [WF] Gailen Shelter, [place]                           Medical Decision Making Amount and/or Complexity of Data Reviewed Labs: ordered. Radiology: ordered.  Risk Prescription drug management.   This patient presents to the ED for concern of episodic chest pain with no chest pain currently, chronic bilateral leg swelling, this involves a number of treatment options, and is a complaint that carries with it a moderate risk of complications and morbidity.  The differential diagnosis includes ACS unlikely, angina also  somewhat unlikely given story. More likely MSK however +risk factors will recommend FU w cards.  Return precautions given.   Co morbidities: Discussed in HPI   Brief History:  Patient is a 51 year old male with past medical history significant for hypertension, prediabetes, hyperlipidemia, morbid obesity   Patient presents emergency room today with complaints of chest pain over the past 1 week.  Seems that he also has some shortness of breath with this.  He describes the episodes of chest pain as occurring when he is lifting pallets.  He does not have any chest pain when walking up stairs or walking for a great distance.  He denies any nausea lightness or dizziness.  He states he is suffering from continued bilateral lower extremity swelling which he states he has been struggling with for some time.  He states he takes all of his medications however on further questioning it seems that he does not reliably take his hydrochlorothiazide.  He follows with cardiology Dr. Algie Coffer and states he had a stress test done 1 year ago and was told that it was normal.  He does not currently have any chest pain.  He states that his last episode of chest pain was yesterday when he was lifting a heavy pallet.  He denies any asymmetric swelling in his legs.  Denies any hemoptysis    EMR reviewed including pt PMHx, past surgical history and past visits to ER.   See HPI for  more details   Lab Tests:   I ordered and independently interpreted labs. Labs notable for troponin x2 somewhat elevated but flat.  BNP unremarkable.  BMP without electrolyte abnormality.  CBC unremarkable.  Urinalysis unremarkable.   Imaging Studies:  NAD. I personally reviewed all imaging studies and no acute abnormality found. I agree with radiology interpretation.  Chest x-ray unremarkable I personally viewed these images  Cardiac Monitoring:  .The patient was maintained on a cardiac monitor.  I personally viewed and interpreted the cardiac monitored which showed an underlying rhythm of: NSR .EKG non-ischemic   Medicines ordered:    Critical Interventions:  .   Consults/Attending Physician   .I discussed this case with my attending physician who cosigned this note including patient's presenting symptoms, physical exam, and planned diagnostics and interventions. Attending physician stated agreement with plan or made changes to plan which were implemented.   Reevaluation:  After the interventions noted above I re-evaluated patient and found that they have :stayed the same no chest pain   Social Determinants of Health:  .    Problem List / ED Course:  ***   Dispostion:  After consideration of the diagnostic results and the patients response to treatment, I feel that the patent would benefit from ***     Final Clinical Impression(s) / ED Diagnoses Final diagnoses:  Leg swelling  Bilateral lower extremity edema  Atypical chest pain    Rx / DC Orders ED Discharge Orders          Ordered    furosemide (LASIX) 20 MG tablet  Daily        05/22/22 2045

## 2022-05-22 NOTE — Discharge Instructions (Addendum)
I sent refills of your medications to your pharmacy. Please follow-up with Dr. Algie Coffer  Please return to the emergency room for any new or worsening symptoms.  I have prescribed you a low dose of a medicine (different from HCTZ which you are no longer taking) that should help with hte swelling in your legs.  Take this medication in the more this will help mitigate the issues of urinating more during the night.

## 2022-05-22 NOTE — ED Notes (Signed)
Received verbal report from Katrina Y RN at this time °

## 2022-05-22 NOTE — ED Provider Notes (Signed)
[facility] EMERGENCY DEPARTMENT Provider Note   CSN: [id number] Arrival date & time: 05/22/22  1659     History {Add pertinent medical, surgical, social history, OB history to HPI:1} Chief Complaint  Patient presents with   Leg Swelling    Max Greene is a 51 y.o. male.  HPI  Patient is a 51 year old male with past medical history significant for hypertension, prediabetes, hyperlipidemia, morbid obesity   Patient presents emergency room today with complaints of exertional chest pain over the past 1 week.  Seems that he also has some shortness of breath with this.      Home Medications Prior to Admission medications   Medication Sig Start Date End Date Taking? Authorizing Provider  B Complex Vitamins (VITAMIN B COMPLEX PO) Take 5,000 mg by mouth daily.    [provider]  carvedilol (COREG) 25 MG tablet Take 1 tablet (25 mg total) by mouth 2 (two) times daily with a meal. 09/12/18   Julieanne Manson, MD  Cholecalciferol (VITAMIN D PO) Take by mouth daily.    [provider]  ibuprofen (ADVIL) 600 MG tablet Take 1 tablet (600 mg total) by mouth every 6 (six) hours as needed. 01/28/21   Fayrene Helper, PA-C  losartan-hydrochlorothiazide (HYZAAR) 100-25 MG tablet Take 1 tablet by mouth daily. Patient not taking: Reported on 07/25/2018 04/24/18   Julieanne Manson, MD  Magnesium 400 MG CAPS Take by mouth daily.    [provider]  Omega-3 Fatty Acids (FISH OIL PO) Take by mouth daily.    [provider]  Zinc 50 MG CAPS Take by mouth. 2 daily    [provider]      Allergies    Patient has no known allergies.    Review of Systems   Review of Systems  Physical Exam Updated Vital Signs BP (!) 144/84   Pulse 74   Temp 98.4 F (36.9 C) (Oral)   Resp 20   Ht 6\' 2"  (1.88 m)   Wt (!) 170.1 kg   SpO2 98%   BMI 48.15 kg/m  Physical Exam Vitals and nursing note reviewed.  Constitutional:       General: He is not in acute distress. HENT:     Head: Normocephalic and atraumatic.     Nose: Nose normal.  Eyes:     General: No scleral icterus. Cardiovascular:     Rate and Rhythm: Normal rate and regular rhythm.     Pulses: Normal pulses.     Heart sounds: Normal heart sounds.  Pulmonary:     Effort: Pulmonary effort is normal. No respiratory distress.     Breath sounds: Normal breath sounds. No wheezing.  Abdominal:     Palpations: Abdomen is soft.     Tenderness: There is no abdominal tenderness. There is no guarding or rebound.  Musculoskeletal:     Cervical back: Normal range of motion.     Right lower leg: Edema present.     Left lower leg: Edema present.     Comments: Bilateral symmetric nonpitting edema  Skin:    General: Skin is warm and dry.     Capillary Refill: Capillary refill takes less than 2 seconds.  Neurological:     Mental Status: He is alert. Mental status is at baseline.  Psychiatric:        Mood and Affect: Mood normal.        Behavior: Behavior normal.     ED Results / Procedures /  Treatments   Labs (all labs ordered are listed, but only abnormal results are displayed) Labs Reviewed  BASIC METABOLIC PANEL - Abnormal; Notable for the following components:      Result Value   Glucose, Bld 109 (*)    All other components within normal limits  TROPONIN I (HIGH SENSITIVITY) - Abnormal; Notable for the following components:   Troponin I (High Sensitivity) 48 (*)    All other components within normal limits  CBC  BRAIN NATRIURETIC PEPTIDE  URINALYSIS, ROUTINE W REFLEX MICROSCOPIC  TROPONIN I (HIGH SENSITIVITY)    EKG EKG Interpretation  Date/Time:  Saturday May 22 2022 17:37:24 EDT Ventricular Rate:  84 PR Interval:  198 QRS Duration: 78 QT Interval:  358 QTC Calculation: 423 R Axis:   16 Text Interpretation: Normal sinus rhythm Cannot rule out Anterior infarct , age undetermined Lateral injury pattern Abnormal ECG No previous ECGs  available Confirmed by Jacalyn Lefevre [phone]) on 05/22/2022 6:25:35 PM  Radiology DG Chest 2 View  Result Date: 05/22/2022 CLINICAL DATA:  Chest pain and dyspnea EXAM: CHEST - 2 VIEW COMPARISON:  Radiographs 01/29/2020 FINDINGS: Stable cardiomediastinal silhouette. Bibasilar atelectasis. No other focal consolidation, pleural effusion, or pneumothorax. No acute osseous abnormality. IMPRESSION: No active cardiopulmonary disease. Electronically Signed   By: Minerva Fester M.D.   On: 05/22/2022 19:01    Procedures Procedures  {Document cardiac monitor, telemetry assessment procedure when appropriate:1}  Medications Ordered in ED Medications - No data to display  ED Course/ Medical Decision Making/ A&P Clinical Course as of 05/22/22 1935  Sat May 22, 2022  1904 States CP exertional for 1 week.  No pain currently.  [WF]  1905 No smoking hx, DM,   Yes HTN, cholesterol on meds.   PCP is [facility] off [address] [WF]  [address] [WF]    Clinical Course User Index [WF] Gailen Shelter, [place]                           Medical Decision Making Amount and/or Complexity of Data Reviewed Labs: ordered. Radiology: ordered.   ***  {Document critical care time when appropriate:1} {Document review of labs and clinical decision tools ie heart score, Chads2Vasc2 etc:1}  {Document your independent review of radiology images, and any outside records:1} {Document your discussion with family members, caretakers, and with consultants:1} {Document social determinants of health affecting pt's care:1} {Document your decision making why or why not admission, treatments were needed:1} Final Clinical Impression(s) / ED Diagnoses Final diagnoses:  None    Rx / DC Orders ED Discharge Orders     None

## 2022-05-22 NOTE — ED Notes (Signed)
ED provider at bedside.

## 2022-05-22 NOTE — ED Triage Notes (Addendum)
Complains of leg swelling x 2-3 years.  Reports leg swells and then goes down.  Patient is on losartan/hctz Reports having intermittent sob and mild chest heaviness.
